# Patient Record
Sex: Male | Born: 2000
Health system: Southern US, Community
[De-identification: ages and names within clinical notes are randomized; demographics above are authoritative.]

## PROBLEM LIST (undated history)

## (undated) DIAGNOSIS — Z8669 Personal history of other diseases of the nervous system and sense organs: Secondary | ICD-10-CM

## (undated) DIAGNOSIS — D649 Anemia, unspecified: Secondary | ICD-10-CM

## (undated) DIAGNOSIS — J45901 Unspecified asthma with (acute) exacerbation: Secondary | ICD-10-CM

## (undated) HISTORY — DX: Unspecified asthma with (acute) exacerbation: J45.901

## (undated) HISTORY — PX: ADENOIDECTOMY: SUR15

## (undated) HISTORY — DX: Anemia, unspecified: D64.9

## (undated) HISTORY — DX: Personal history of other diseases of the nervous system and sense organs: Z86.69

---
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
## 2023-03-23 ENCOUNTER — Telehealth: Payer: Self-pay | Admitting: Internal Medicine

## 2023-03-23 NOTE — Telephone Encounter (Signed)
 Called pt to sch physical, left vm. Pls sch pt for appt.
